# Patient Record
Sex: Male | Born: 1940 | Race: White | Hispanic: No
Health system: Southern US, Community
[De-identification: ages and names within clinical notes are randomized; demographics above are authoritative.]

---

## 2014-09-15 ENCOUNTER — Ambulatory Visit: Payer: Self-pay | Admitting: Family Medicine

## 2014-09-23 ENCOUNTER — Ambulatory Visit: Payer: Self-pay | Admitting: Family Medicine

## 2014-10-28 ENCOUNTER — Ambulatory Visit
Admit: 2014-10-28 | Disposition: A | Discharge status: Home / Self Care | Payer: Self-pay | Attending: Oncology | Admitting: Oncology

## 2014-10-29 ENCOUNTER — Ambulatory Visit
Admit: 2014-10-29 | Disposition: A | Discharge status: Home / Self Care | Payer: Self-pay | Attending: Oncology | Admitting: Oncology

## 2014-11-13 ENCOUNTER — Ambulatory Visit
Admit: 2014-11-13 | Disposition: A | Discharge status: Home / Self Care | Payer: Self-pay | Attending: Oncology | Admitting: Oncology

## 2014-11-30 ENCOUNTER — Ambulatory Visit
Admit: 2014-11-30 | Disposition: A | Discharge status: Home / Self Care | Payer: Self-pay | Attending: Oncology | Admitting: Oncology

## 2014-12-06 ENCOUNTER — Inpatient Hospital Stay
Admit: 2014-12-06 | Disposition: A | Discharge status: Hospice | Payer: Self-pay | Attending: Internal Medicine | Admitting: Internal Medicine

## 2014-12-06 LAB — URINALYSIS, COMPLETE
BILIRUBIN, UR: NEGATIVE
Bacteria: NONE SEEN
Glucose,UR: NEGATIVE mg/dL (ref 0–75)
KETONE: NEGATIVE
Leukocyte Esterase: NEGATIVE
Nitrite: NEGATIVE
PH: 6 (ref 4.5–8.0)
Specific Gravity: 1.023 (ref 1.003–1.030)

## 2014-12-06 LAB — CBC WITH DIFFERENTIAL/PLATELET
BASOS ABS: 0 10*3/uL (ref 0.0–0.1)
Basophil %: 0.2 %
Eosinophil #: 0.1 10*3/uL (ref 0.0–0.7)
Eosinophil %: 0.6 %
HCT: 39 % — ABNORMAL LOW (ref 40.0–52.0)
HGB: 13.1 g/dL (ref 13.0–18.0)
LYMPHS ABS: 1.4 10*3/uL (ref 1.0–3.6)
Lymphocyte %: 7 %
MCH: 31.5 pg (ref 26.0–34.0)
MCHC: 33.5 g/dL (ref 32.0–36.0)
MCV: 94 fL (ref 80–100)
Monocyte #: 1.3 x10 3/mm — ABNORMAL HIGH (ref 0.2–1.0)
Monocyte %: 6.8 %
Neutrophil #: 16.7 10*3/uL — ABNORMAL HIGH (ref 1.4–6.5)
Neutrophil %: 85.4 %
PLATELETS: 160 10*3/uL (ref 150–440)
RBC: 4.14 10*6/uL — ABNORMAL LOW (ref 4.40–5.90)
RDW: 13.8 % (ref 11.5–14.5)
WBC: 19.6 10*3/uL — ABNORMAL HIGH (ref 3.8–10.6)

## 2014-12-06 LAB — COMPREHENSIVE METABOLIC PANEL
ALBUMIN: 3.6 g/dL
ALK PHOS: 251 U/L — AB
ANION GAP: 10 (ref 7–16)
BILIRUBIN TOTAL: 1.1 mg/dL
BUN: 47 mg/dL — ABNORMAL HIGH
CREATININE: 0.92 mg/dL
Calcium, Total: 9.6 mg/dL
Chloride: 98 mmol/L — ABNORMAL LOW
Co2: 30 mmol/L
EGFR (Non-African Amer.): >60
GLUCOSE: 164 mg/dL — AB
Potassium: 3.5 mmol/L
SGOT(AST): 122 U/L — ABNORMAL HIGH
SGPT (ALT): 70 U/L — ABNORMAL HIGH
Sodium: 138 mmol/L
TOTAL PROTEIN: 7.6 g/dL

## 2014-12-06 LAB — APTT: Activated PTT: 34.4 secs (ref 23.6–35.9)

## 2014-12-06 LAB — PROTIME-INR
INR: 1.1
PROTHROMBIN TIME: 14.3 s

## 2014-12-06 LAB — LACTIC ACID, PLASMA: Lactic Acid, Venous: 1 mmol/L

## 2014-12-06 LAB — TROPONIN I: Troponin-I: <0.03 ng/mL

## 2014-12-07 ENCOUNTER — Ambulatory Visit: Admit: 2014-12-07 | Disposition: A | Discharge status: Home / Self Care | Payer: Self-pay | Admitting: Vascular Surgery

## 2014-12-07 LAB — COMPREHENSIVE METABOLIC PANEL
ALT: 63 U/L
ANION GAP: 7 (ref 7–16)
Albumin: 3 g/dL — ABNORMAL LOW
Alkaline Phosphatase: 192 U/L — ABNORMAL HIGH
BUN: 38 mg/dL — AB
Bilirubin,Total: 1.5 mg/dL — ABNORMAL HIGH
CHLORIDE: 104 mmol/L
CREATININE: 0.73 mg/dL
Calcium, Total: 9 mg/dL
Co2: 28 mmol/L
EGFR (African American): >60
EGFR (Non-African Amer.): >60
Glucose: 124 mg/dL — ABNORMAL HIGH
POTASSIUM: 3.8 mmol/L
SGOT(AST): 101 U/L — ABNORMAL HIGH
SODIUM: 139 mmol/L
TOTAL PROTEIN: 6.3 g/dL — AB

## 2014-12-07 LAB — CBC WITH DIFFERENTIAL/PLATELET
BASOS PCT: 0.2 %
Basophil #: 0 10*3/uL (ref 0.0–0.1)
Eosinophil #: 0.1 10*3/uL (ref 0.0–0.7)
Eosinophil %: 0.6 %
HCT: 35.2 % — ABNORMAL LOW (ref 40.0–52.0)
HGB: 11.7 g/dL — ABNORMAL LOW (ref 13.0–18.0)
Lymphocyte #: 1.2 10*3/uL (ref 1.0–3.6)
Lymphocyte %: 6.8 %
MCH: 31.2 pg (ref 26.0–34.0)
MCHC: 33.3 g/dL (ref 32.0–36.0)
MCV: 94 fL (ref 80–100)
MONO ABS: 1.5 x10 3/mm — AB (ref 0.2–1.0)
Monocyte %: 8 %
NEUTROS ABS: 15.6 10*3/uL — AB (ref 1.4–6.5)
NEUTROS PCT: 84.4 %
Platelet: 142 10*3/uL — ABNORMAL LOW (ref 150–440)
RBC: 3.77 10*6/uL — ABNORMAL LOW (ref 4.40–5.90)
RDW: 13.4 % (ref 11.5–14.5)
WBC: 18.5 10*3/uL — ABNORMAL HIGH (ref 3.8–10.6)

## 2014-12-11 LAB — CULTURE, BLOOD (SINGLE)

## 2014-12-13 NOTE — Op Note (Signed)
PATIENT NAME:  [name], [name] MR#:  [id number] DATE OF BIRTH:  08/21/1940  DATE OF PROCEDURE:  12/07/2014  PREOPERATIVE DIAGNOSES:  1. Peripheral arterial disease with gangrene, left lower extremity.  2. Stage IV liver cancer.  3. Ulceration, left foot and toes.   POSTOPERATIVE DIAGNOSES: 1. Peripheral arterial disease with gangrene, left lower extremity.  2. Stage IV liver cancer.  3. Ulceration, left foot and toes.  4. Vein thrombosis embolus to left lower extremity tibial arteries.   PROCEDURES PERFORMED:  Include: 1. Ultrasound-guidance vascular access to right femoral artery.  2. Catheter placement to left anterior tibial and left posterior tibial arteries from right femoral approach.  3. Aortogram and selective left lower extremity angiogram.  4. Percutaneous transluminal angioplasty of right common iliac artery with 5 mm diameter Lutonix drug-coated angioplasty balloon.  5. Percutaneous transluminal angioplasty of left common and external iliac artery with 6 mm diameter Lutonix drug-coated angioplasty balloon.  6. Catheter-directed thrombolysis with 4 mg of tPA delivered into the left anterior tibial and left posterior tibial arteries.  7. Mechanical rheolytic thrombectomy of left anterior tibial and posterior tibial arteries, tibioperoneal trunk, and distal popliteal artery.  8. Percutaneous transluminal angioplasty of left anterior tibial artery with 3 mm diameter angioplasty balloon.  9. Percutaneous transluminal angioplasty of left posterior tibial artery with 3 mm diameter angioplasty balloon.  10.  Percutaneous transluminal angioplasty of left superficial femoral artery with 5 mm diameter Lutonix drug-coated angioplasty balloon.  11.  Self-expanding stent placement to the left superficial femoral artery for greater than 50% residual stenosis and dissection after angioplasty.  12.  StarClose closure device, right femoral artery.   SURGEON:  Joshua S. Dew, MD.   ANESTHESIA:   Local with moderate conscious sedation.   BLOOD LOSS:  Minimal.   CONTRAST USED:  105 mL of Visipaque.   FLUOROSCOPY TIME:  12 minutes.   INDICATION FOR PROCEDURE:  This is a gentleman with advanced liver cancer who I saw in the office with ischemic rest pain last week.  Over the weekend, he began having skin changes to the foot consistent with gangrene.  He was admitted to the hospital last night.  He was brought in this morning for angiography for further evaluation and potential treatment.  Risks and benefits were discussed.  Informed consent was obtained.   DESCRIPTION OF PROCEDURE:  The patient was brought to the vascular suite. Groins were shaved and prepped, and a sterile surgical field was created.  Due to poor a palpable right femoral pulse, ultrasound was used to access the right femoral artery.  This was done under direct ultrasound guidance without difficulty with a Seldinger needle.  The right femoral artery was calcified but not particularly stenotic.  The artery was accessed under direct ultrasound guidance without difficulty and a permanent image was recorded.   A J-wire was placed.  It did not easily traverse through the right iliac system.  Imaging through the right femoral sheath showed diffuse disease throughout the right common and proximal external iliac artery with severe stenosis of greater than 75%.  I was able to navigate a Kumpe catheter and a Terumo Advantage wire through this.  A pigtail catheter was placed into the aorta at  the L1-L2 level and AP aortogram was performed.  I then pulled down and performed the pelvic obliques.  I went ahead and heparinized the patient with 5000 units of intravenous heparin.  The imaging demonstrated the aorta to be patent although heavily calcified.  The   right iliac system had high-grade stenosis, as described above.  The left renal artery had moderate to high-grade stenosis.  The right renal artery appeared patent without significant stenosis.   The left common and external iliac artery had at least moderate-appearing stenosis in the 60% to 70% range over a several centimeter segment.  I then crossed the aortic bifurcation with a RIM catheter and Advantage wire and advanced down into the left common femoral artery.  This demonstrated the SFA to be somewhat diffusely diseased in the proximal and mid segment.  In the mid-to-distal SFA, there was short-segment occlusion of the SFA with reconstitution in the distal SFA.  The popliteal artery appeared reasonably normal; however, there was very minimal flow in the tibial arteries.  I advanced a 100 cm Kumpe catheter with the Advantage wire into the popliteal artery.  Essentially there was a cutoff of flow in the anterior tibial artery and then in the posterior tibial arteries in their mid and then proximal segments, respectively.  I initially advanced the Kumpe catheter into the anterior tibial artery and performed imaging.  Again this appeared to be thrombosed from the mid anterior tibial artery on.  I then removed this catheter.  I actually exchanged for a 0.0120 straight glide cath and cannulated the posterior tibial artery with the Advantage wire and again this was occluded from the proximal segment on.  This appeared to be thrombosis.  I advanced a catheter down into the distal posterior tibial artery without difficulty and advanced the catheter and confirmed intraluminal flow in the foot.  I then removed this catheter and did similarly on the anterior tibial artery.  Prior to placing the large sheath over, a 5 mm diameter x 8 cm Lutonix drug-coated angioplasty balloon was used to treat the right common and external iliac artery with good angiographic result and about 30% residual stenosis. I then took a 6-French Ansel sheath down into the SFA.  I had cannulated both anterior and posterior tibial arteries and planned our treatment of the left foot at this point after catheter placements.  I then used the  AngioJet Omni catheter.  I instilled 4 mg of tPA with about 2 mg in the anterior tibial artery.  I then allowed this to dwell and cannulated the posterior tibial artery.  I advanced the wire down to the foot and put 2 more milligrams of tPA into the posterior tibial artery.  While this was dwelling, the superficial femoral artery was treated.  I used a 5 mm diameter x 15 cm Lutonix drug-coated angioplasty balloon, but there was a suboptimal result after angioplasty in the mid-to-distal left SFA, and I had to cover this area with a 6 mm diameter x 15 cm length LifeStent.  This was post dilated with a 5 mm balloon.  The proximal SFA was treated with a 5 mm balloon with good angiographic completion result.  We then improved his SFA but there was still very poor flow distally.  Mechanical rheolytic thrombectomy was performed first in the anterior tibial artery for about 100 mL of effluent returned.  I then went down the posterior tibial artery for another 65-70 mL of effluent returned.  Nonetheless, there was still very poor flow distally.  I then brought a 3 mm diameter angioplasty balloon, started in the posterior tibial artery from the ankle up through the origin and into the tibioperoneal trunk. Again multiple areas of waste were seen with inflation of the balloon and the balloon was deflated, and   even with a catheter placed selectively into the posterior tibial artery, there was very sluggish flow that terminated before getting to the foot.  This did appear to have the appearance of thrombus on imaging.  At this point, I turned my attention back to the anterior tibial artery.  The Advantage wire was placed into the foot there.  I inflated from the ankle back up through the origin of the anterior tibial artery with a 3 mm diameter angioplasty balloon.  This did result in a little bit of flow going through the anterior tibial artery but there was still thrombus distally.  I felt at this point I had done all I could  do from a revascularization standpoint and we would have to try to anticoagulate him with the exception of the iliac lesion on the left.  I pulled the sheath back and then treated this with a 6 mm diameter Lutonix drug-coated angioplasty balloon in the common and proximal external iliac artery.  The balloon inflation was held for 1  minute.  Completion angiogram following this showed significantly improved flow with only about 20% or 25% residual stenosis.  At this point, I elected to terminate the procedure.  The sheath was removed.  StarClose closure device was deployed in the usual fashion with excellent hemostatic result.  The patient tolerated the procedure well and was taken to the recovery room in stable condition.    ____________________________ Joshua S. Dew, MD jsd:kc D: 12/07/2014 12:39:47 ET T: 12/07/2014 16:23:24 ET JOB#: [id number]  cc: Joshua S. Dew, MD, <Dictator> Joshua S DEW MD ELECTRONICALLY SIGNED 12/11/2014 17:27 

## 2014-12-13 NOTE — H&P (Signed)
PATIENT NAME:  Joshua [name], Joshua [name] MR#:  [id number] DATE OF BIRTH:  10-16-1940  DATE OF ADMISSION:  12/07/2014  REFERRING PHYSICIAN: Bobetta Lime A. Inocencio Homes, MD  PRIMARY CARE PHYSICIAN: Steele Sizer, MD  ONCOLOGY: Gerome Sam. Choksi, MD  CHIEF COMPLAINT: Left foot pain.   HISTORY OF PRESENT ILLNESS: A 74 year old Caucasian male with a history of hypertension, essential; gastroesophageal reflux disease without esophagitis; unspecified cancer, this is either extensive hepatocellular carcinoma versus liver metastases with unknown primary. He actually refused any further workup. He is presenting with left foot pain. The patient is a poor historian. History obtained from him as well as daughter who is present at  bedside. Describes a 2-week-or-so duration of progressive left foot pain described only as "pain." Intensity is 7/10, worse with movements, no relieving factors. Noticed associated color change of the foot; progressive bluish to black changes, distal moving proximally; and noticed that it was cold to touch. Given symptom duration and worsening in intensity, decided to present to the hospital for further workup and evaluation after speaking to his vascular surgeon, Dr. Wyn Quaker.   REVIEW OF SYSTEMS: CONSTITUTIONAL: Denies fevers, chills, fatigue, weakness.  EYES: Denies blurred vision, double vision, eye pain.  EARS, NOSE, THROAT: Denies tinnitus, ear pain, hearing loss.  RESPIRATORY: Denies cough, wheeze, shortness of breath.  CARDIOVASCULAR: Denies chest pain, palpitations, edema. GASTROINTESTINAL: Denies nausea, vomiting, diarrhea, abdominal pain. GENITOURINARY: Denies dysuria, hematuria. ENDOCRINE: Denies nocturia or thyroid problems.  HEMATOLOGIC AND LYMPHATIC: Denies easy bruising and bleeding.  SKIN: Positive for bluish black discoloration of the left foot as described above.  MUSCULOSKELETAL: Positive for pain in the left foot as described above. Denies any neck, back, shoulder, knees, hip pain, or  further arthritic symptoms. NEUROLOGIC: Denies paralysis, paresthesias.  PSYCHIATRIC: Denies anxiety or depressive symptoms.  Otherwise, full review of systems performed by me is negative.   PAST MEDICAL HISTORY: Includes unspecified cancer, either extensive hepatocellular carcinoma versus liver metastasis of unknown primary; gastroesophageal reflux disease without esophagitis; hypertension, essential; peripheral vascular disease.   SOCIAL HISTORY: Positive for everyday tobacco use. Denies any alcohol or drug use.   FAMILY HISTORY: Positive for coronary artery disease.   ALLERGIES: No known drug allergies.   HOME MEDICATIONS: Include fentanyl patch 75 mcg every 72 hours, oxycodone 5 mg p.o. q.4-6 hours as needed for pain, lorazepam 0.5 mg p.o. daily, fluoxetine 10 mg p.o. daily, Ambien 5 mg p.o. at nighttime as needed for sleep, metoprolol 100 mg p.o. b.i.d., Norvasc 5 mg p.o. daily, melatonin 5 mg p.o. at bedtime, Nexium 20 mg p.o. daily.   PHYSICAL EXAMINATION:  VITAL SIGNS: Temperature 97.3, heart rate 107, respiratory rate is 18, blood pressure 143/83, saturating 92% on room air. Weight 53.1 kg, BMI 18.3.  GENERAL: Disheveled, weak, frail, chronically ill-appearing Caucasian gentleman, currently in no acute distress.  HEAD: Normocephalic, atraumatic.  EYES: Pupils equal, round, reactive to light. Extraocular muscles intact. No scleral icterus.  MOUTH: Moist mucosal membranes. Dentition intact. No abscess noted. EARS, NOSE, THROAT: Clear without exudates. No external lesions.  NECK: Supple. No thyromegaly. No nodules. No JVD.  PULMONARY: Clear to auscultation bilaterally without wheezes, rales, rhonchi. No use of accessory muscles. Good respiratory effort. CHEST: Nontender to palpation.  CARDIOVASCULAR: S1, S2, tachycardic. No murmurs, rubs, or gallops. No edema. Pedal pulses diminished, left pedal pulse absent on Doppler.  GASTROINTESTINAL: Soft, nontender, nondistended. No masses.  Positive bowel sounds. No hepatosplenomegaly. MUSCULOSKELETAL: No swelling, clubbing. Positive for edema in the left foot to the mid shin.  Range of motion full in all extremities.  NEUROLOGIC: Cranial nerves II through XII intact. No gross focal neurological deficits. Sensation intact. Reflexes intact.  SKIN: Left distal foot with bluish blackish discoloration covering the entire plantar surface of the foot and extending to about halfway up the foot as well. No further lesions, rashes, ulcerations, or further areas of cyanosis. Skin warm, dry; however, left lower extremity about shin down cold to touch.  PSYCHIATRIC: Mood, affect blunted. He is awake, alert, oriented x3. Insight and judgment intact.   LABORATORY DATA: Sodium of 138, potassium 3.5, chloride 98, bicarbonate of 30, BUN 47, creatinine 0.92, glucose 164. LFTs: Alkaline phosphatase 251, AST 122, ALT 70, otherwise within normal limits. WBC of 19.6, hemoglobin 13.1, platelets of 160,000.    ASSESSMENT AND PLAN: A 74 year old Caucasian gentleman with a history of  hypertension, essential; gastroesophageal reflux disease without esophagitis; as well as unspecified cancer, either extensive hepatocellular carcinoma versus liver metastasis with unknown primary, presenting with left foot pain. 1.  Ischemic left foot. The case was discussed with Dr. Wyn Quakerew of vascular surgery. He is to perform an angiogram in the morning. Continue with pain medications as required. Antibiotic coverage with vancomycin as well as Zosyn. 2.  Gastroesophageal reflux disease. Continue proton pump inhibitor therapy. 3.  Hypertension, essential. Continue with his dosages of Norvasc as well as metoprolol.  CODE STATUS: The patient is full code.   TIME SPENT: 35 minutes.   ____________________________ Cletis Athensavid K. Kerim Statzer, MD dkh:ST D: 12/06/2014 22:18:38 ET T: 12/07/2014 00:38:47 ET JOB#: [id number]  cc: Cletis Athensavid K. Gayathri Futrell, MD, <Dictator> Khadejah Son Synetta ShadowK Shley Dolby MD ELECTRONICALLY SIGNED  12/09/2014 1:42

## 2014-12-13 NOTE — Discharge Summary (Signed)
PATIENT NAME:  Joshua [name], Enos E MR#:  [id number] DATE OF BIRTH:  June 11, 1941  DATE OF ADMISSION:  12/06/2014 DATE OF DISCHARGE:  12/08/2014  For a detailed note, please take a look at the history and physical done on admission by Dr. Angelica Ranavid Hower.   DIAGNOSES AT DISCHARGE: As follows: Acute left lower extremity ischemia, status post angioplasty and stent placement, advanced hepatocellular carcinoma, depression, chronic pain, urinary retention.   DIET: The patient is being discharged on a regular diet.   ACTIVITY: As tolerated. The patient is being discharged home with hospice services.   FOLLOWUP: With Dr. Vonita MossMark Crissman, and also with Dr. Doylene Canninghoksi in the next 1 to 2 weeks.   DISCHARGE MEDICATIONS: Metoprolol tartrate 100 mg b.i.d., lorazepam 0.5 mg daily, omeprazole 20 mg daily, fluoxetine 10 mg daily, Ambien 5 mg at bedtime, oxycodone 5 mg every 4 to 6 hours as needed, amlodipine 5 mg daily, Duragesic patch 75 mcg q. 72 hours, melatonin 3 mg extended release at bedtime, Nexium 20 mg daily, Flomax 0.4 mg daily, apixaban 2.5 mg b.i.d., and Seroquel 25 mg at bedtime.   CONSULTANTS DURING THE HOSPITAL COURSE: Dr. Festus BarrenJason Dew from vascular surgery.   BRIEF HOSPITAL COURSE: This is a 74 year old male with medical problems as mentioned above, presented to the hospital with left lower extremity redness, cyanosis, pain, and blistering.   PROBLEM LIST: 1.  Acute left lower extremity ischemia. This was likely the cause of the patient's left lower extremity pain, swelling, and blistering. The patient underwent angiogram by Dr. Wyn Quakerew, was noted to have multivessel disease; therefore, underwent angioplasty and stent to multiple vessels of his left lower extremity. Despite getting the intervention, his clinical symptoms have mildly improved. Given his history of advanced hepatocellular carcinoma, h e is high risk for having worsening vascular disease. He is therefore being discharged on oral Eliquis. As stated, he will  continue fentanyl patch and oral oxycodone for pain. He is being discharged home with hospice services for further pain control. He was on broad-spectrum IV antibiotics with IV vancomycin and Zosyn, which has now been discontinued.  2.  History of advanced liver cancer. The patient's prognosis is very poor. He follows with Dr. Doylene Canninghoksi. The family has opted for hospice services, which he is being discharged home with hospice. 3.  Depression. The patient was maintained on his Prozac, he will resume that.  4.  Urinary retention. The patient was started on Flomax. He received in and out catheters while in the hospital, although he is voiding well now.  5.  Hypertension. The patient remained hemodynamically stable. He will continue metoprolol and Norvasc. 6.  Gastroesophageal reflux disease. The patient will continue his Protonix.  7.  Insomnia. The patient will continue his Ambien and he was started on a small dose of Seroquel.   CODE STATUS:  The patient is a full code.   TIME SPENT ON DISCHARGE: Forty minutes.    ____________________________ Rolly PancakeVivek J. Cherlynn KaiserSainani, MD vjs:TM D: 12/08/2014 16:01:36 ET T: 12/09/2014 00:11:58 ET JOB#: [id number]  cc: Rolly PancakeVivek J. Cherlynn KaiserSainani, MD, <Dictator> Steele SizerMark A. Crissman, MD Gerome SamJanak K. Doylene Canninghoksi, MD Houston SirenVIVEK J Geneviene Tesch MD ELECTRONICALLY SIGNED 12/11/2014 14:41

## 2014-12-18 NOTE — Op Note (Signed)
PATIENT NAME:  Joshua [name], Joshua [name] MR#:  [id number] DATE OF BIRTH:  09/28/1940  DATE OF PROCEDURE:  12/07/2014  PREOPERATIVE DIAGNOSES:  1. Peripheral arterial disease with gangrene, left lower extremity.  2. Stage IV liver cancer.  3. Ulceration, left foot and toes.   POSTOPERATIVE DIAGNOSES: 1. Peripheral arterial disease with gangrene, left lower extremity.  2. Stage IV liver cancer.  3. Ulceration, left foot and toes.  4. Vein thrombosis embolus to left lower extremity tibial arteries.   PROCEDURES PERFORMED:  Include: 1. Ultrasound-guidance vascular access to right femoral artery.  2. Catheter placement to left anterior tibial and left posterior tibial arteries from right femoral approach.  3. Aortogram and selective left lower extremity angiogram.  4. Percutaneous transluminal angioplasty of right common iliac artery with 5 mm diameter Lutonix drug-coated angioplasty balloon.  5. Percutaneous transluminal angioplasty of left common and external iliac artery with 6 mm diameter Lutonix drug-coated angioplasty balloon.  6. Catheter-directed thrombolysis with 4 mg of tPA delivered into the left anterior tibial and left posterior tibial arteries.  7. Mechanical rheolytic thrombectomy of left anterior tibial and posterior tibial arteries, tibioperoneal trunk, and distal popliteal artery.  8. Percutaneous transluminal angioplasty of left anterior tibial artery with 3 mm diameter angioplasty balloon.  9. Percutaneous transluminal angioplasty of left posterior tibial artery with 3 mm diameter angioplasty balloon.  10.  Percutaneous transluminal angioplasty of left superficial femoral artery with 5 mm diameter Lutonix drug-coated angioplasty balloon.  11.  Self-expanding stent placement to the left superficial femoral artery for greater than 50% residual stenosis and dissection after angioplasty.  12.  StarClose closure device, right femoral artery.   SURGEON:  Annice Needy, MD.   ANESTHESIA:   Local with moderate conscious sedation.   BLOOD LOSS:  Minimal.   CONTRAST USED:  105 mL of Visipaque.   FLUOROSCOPY TIME:  12 minutes.   INDICATION FOR PROCEDURE:  This is a gentleman with advanced liver cancer who I saw in the office with ischemic rest pain last week.  Over the weekend, he began having skin changes to the foot consistent with gangrene.  He was admitted to the hospital last night.  He was brought in this morning for angiography for further evaluation and potential treatment.  Risks and benefits were discussed.  Informed consent was obtained.   DESCRIPTION OF PROCEDURE:  The patient was brought to the vascular suite. Groins were shaved and prepped, and a sterile surgical field was created.  Due to poor a palpable right femoral pulse, ultrasound was used to access the right femoral artery.  This was done under direct ultrasound guidance without difficulty with a Seldinger needle.  The right femoral artery was calcified but not particularly stenotic.  The artery was accessed under direct ultrasound guidance without difficulty and a permanent image was recorded.   A J-wire was placed.  It did not easily traverse through the right iliac system.  Imaging through the right femoral sheath showed diffuse disease throughout the right common and proximal external iliac artery with severe stenosis of greater than 75%.  I was able to navigate a Kumpe catheter and a Terumo Advantage wire through this.  A pigtail catheter was placed into the aorta at  the L1-L2 level and AP aortogram was performed.  I then pulled down and performed the pelvic obliques.  I went ahead and heparinized the patient with 5000 units of intravenous heparin.  The imaging demonstrated the aorta to be patent although heavily calcified.  The  right iliac system had high-grade stenosis, as described above.  The left renal artery had moderate to high-grade stenosis.  The right renal artery appeared patent without significant stenosis.   The left common and external iliac artery had at least moderate-appearing stenosis in the 60% to 70% range over a several centimeter segment.  I then crossed the aortic bifurcation with a RIM catheter and Advantage wire and advanced down into the left common femoral artery.  This demonstrated the SFA to be somewhat diffusely diseased in the proximal and mid segment.  In the mid-to-distal SFA, there was short-segment occlusion of the SFA with reconstitution in the distal SFA.  The popliteal artery appeared reasonably normal; however, there was very minimal flow in the tibial arteries.  I advanced a 100 cm Kumpe catheter with the Advantage wire into the popliteal artery.  Essentially there was a cutoff of flow in the anterior tibial artery and then in the posterior tibial arteries in their mid and then proximal segments, respectively.  I initially advanced the Kumpe catheter into the anterior tibial artery and performed imaging.  Again this appeared to be thrombosed from the mid anterior tibial artery on.  I then removed this catheter.  I actually exchanged for a 0.0120 straight glide cath and cannulated the posterior tibial artery with the Advantage wire and again this was occluded from the proximal segment on.  This appeared to be thrombosis.  I advanced a catheter down into the distal posterior tibial artery without difficulty and advanced the catheter and confirmed intraluminal flow in the foot.  I then removed this catheter and did similarly on the anterior tibial artery.  Prior to placing the large sheath over, a 5 mm diameter x 8 cm Lutonix drug-coated angioplasty balloon was used to treat the right common and external iliac artery with good angiographic result and about 30% residual stenosis. I then took a 6-French Ansel sheath down into the SFA.  I had cannulated both anterior and posterior tibial arteries and planned our treatment of the left foot at this point after catheter placements.  I then used the  AngioJet Omni catheter.  I instilled 4 mg of tPA with about 2 mg in the anterior tibial artery.  I then allowed this to dwell and cannulated the posterior tibial artery.  I advanced the wire down to the foot and put 2 more milligrams of tPA into the posterior tibial artery.  While this was dwelling, the superficial femoral artery was treated.  I used a 5 mm diameter x 15 cm Lutonix drug-coated angioplasty balloon, but there was a suboptimal result after angioplasty in the mid-to-distal left SFA, and I had to cover this area with a 6 mm diameter x 15 cm length LifeStent.  This was post dilated with a 5 mm balloon.  The proximal SFA was treated with a 5 mm balloon with good angiographic completion result.  We then improved his SFA but there was still very poor flow distally.  Mechanical rheolytic thrombectomy was performed first in the anterior tibial artery for about 100 mL of effluent returned.  I then went down the posterior tibial artery for another 65-70 mL of effluent returned.  Nonetheless, there was still very poor flow distally.  I then brought a 3 mm diameter angioplasty balloon, started in the posterior tibial artery from the ankle up through the origin and into the tibioperoneal trunk. Again multiple areas of waste were seen with inflation of the balloon and the balloon was deflated, and  even with a catheter placed selectively into the posterior tibial artery, there was very sluggish flow that terminated before getting to the foot.  This did appear to have the appearance of thrombus on imaging.  At this point, I turned my attention back to the anterior tibial artery.  The Advantage wire was placed into the foot there.  I inflated from the ankle back up through the origin of the anterior tibial artery with a 3 mm diameter angioplasty balloon.  This did result in a little bit of flow going through the anterior tibial artery but there was still thrombus distally.  I felt at this point I had done all I could  do from a revascularization standpoint and we would have to try to anticoagulate him with the exception of the iliac lesion on the left.  I pulled the sheath back and then treated this with a 6 mm diameter Lutonix drug-coated angioplasty balloon in the common and proximal external iliac artery.  The balloon inflation was held for 1  minute.  Completion angiogram following this showed significantly improved flow with only about 20% or 25% residual stenosis.  At this point, I elected to terminate the procedure.  The sheath was removed.  StarClose closure device was deployed in the usual fashion with excellent hemostatic result.  The patient tolerated the procedure well and was taken to the recovery room in stable condition.    ____________________________ Annice NeedyJason S. Sallee Hogrefe, MD [id number] D: 12/07/2014 12:39:47 ET T: 12/07/2014 16:23:24 ET JOB#: [id number]  cc: Annice NeedyJason S. Del Wiseman, MD, <Dictator> Annice NeedyJASON S Welby Montminy MD ELECTRONICALLY SIGNED 12/11/2014 17:27

## 2015-11-12 IMAGING — US ABDOMEN ULTRASOUND
1 series · 13 of 25 positions shown · non-contrast
Comparison: None.

CLINICAL DATA: Chronic abdominal pain and distention

EXAM:
ULTRASOUND ABDOMEN COMPLETE

[Series 1: abdomen ultrasound · 0.38mm/px · 13 of 117 slices shown]
[im 1/117]
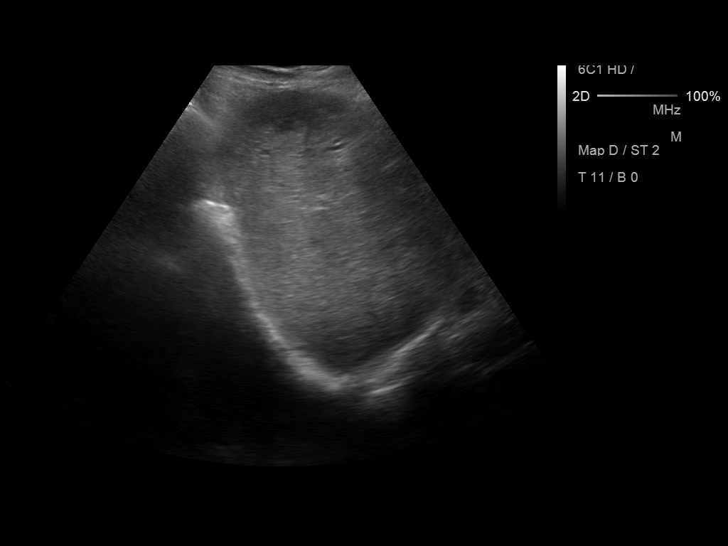
[im 10/117]
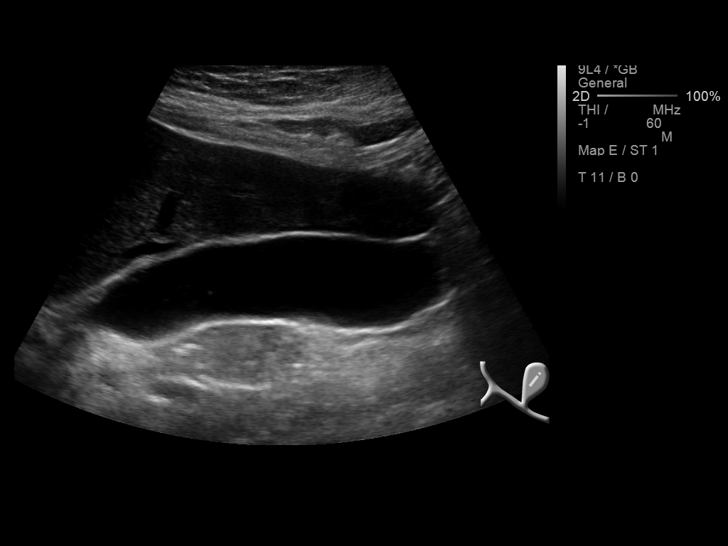
[im 20/117]
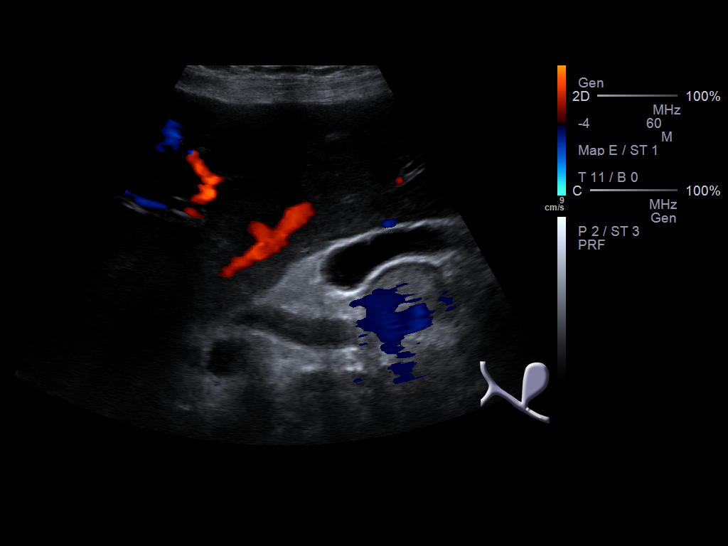
[im 30/117]
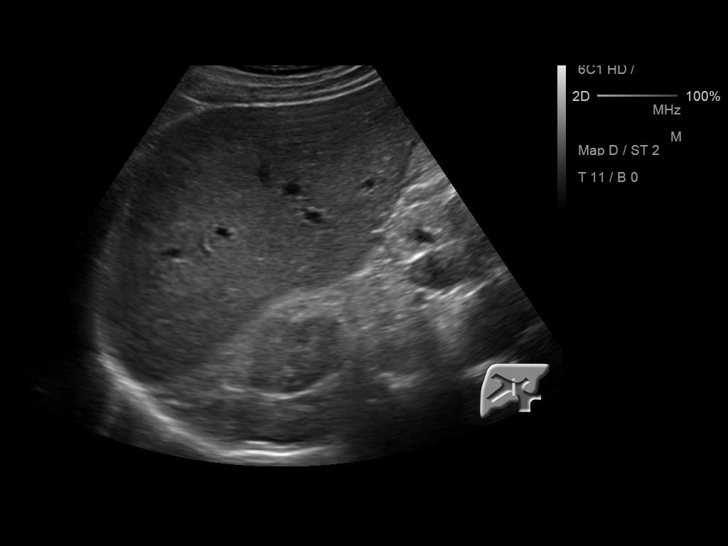
[im 39/117]
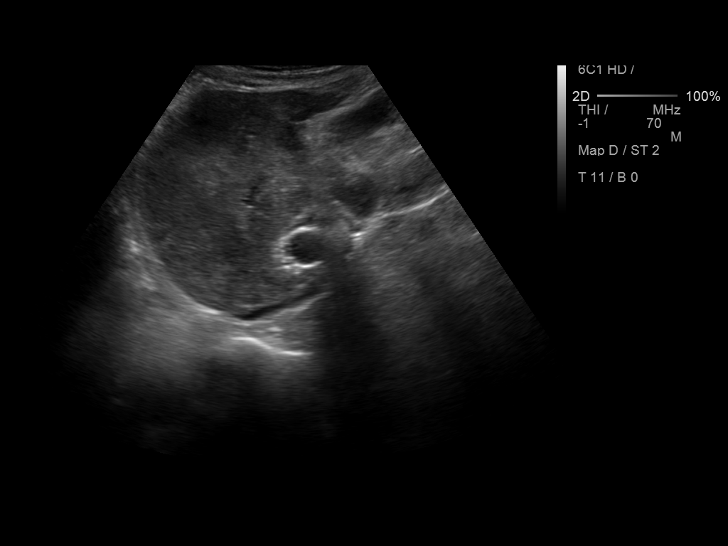
[im 49/117]
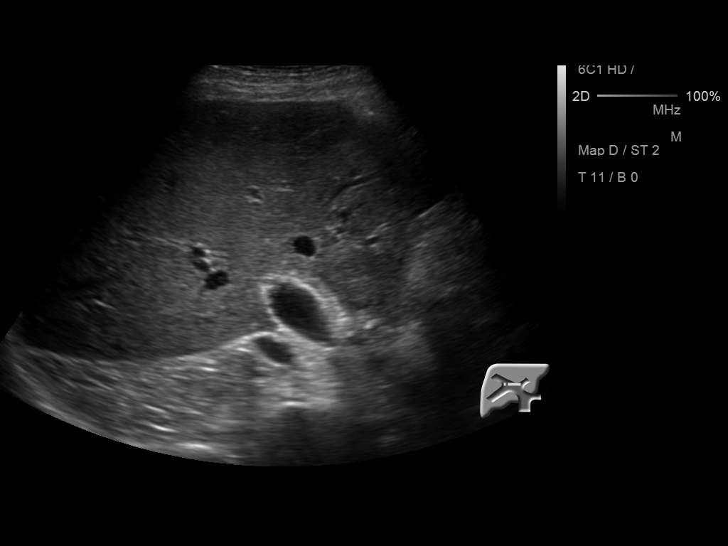
[im 59/117]
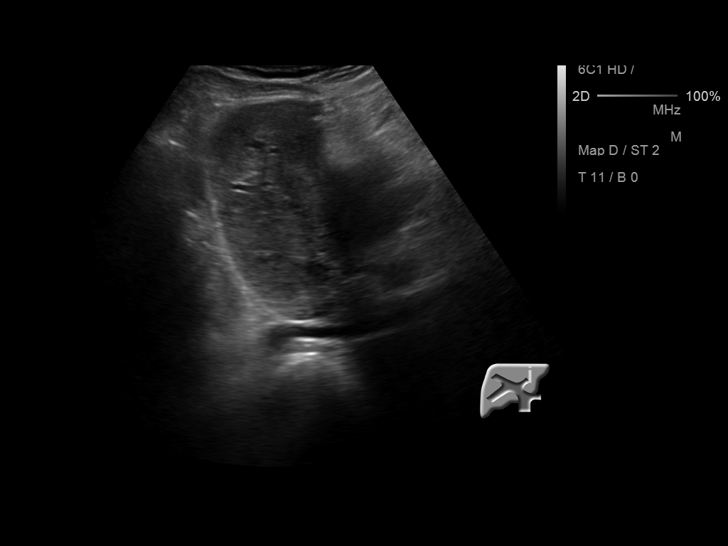
[im 68/117]
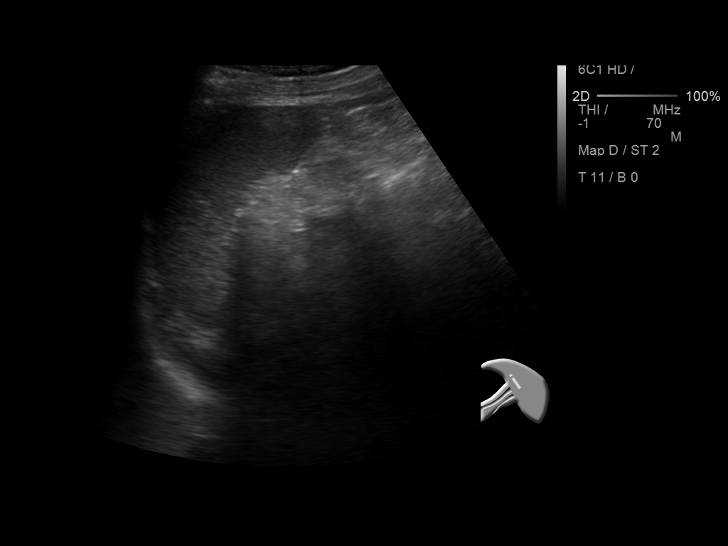
[im 78/117]
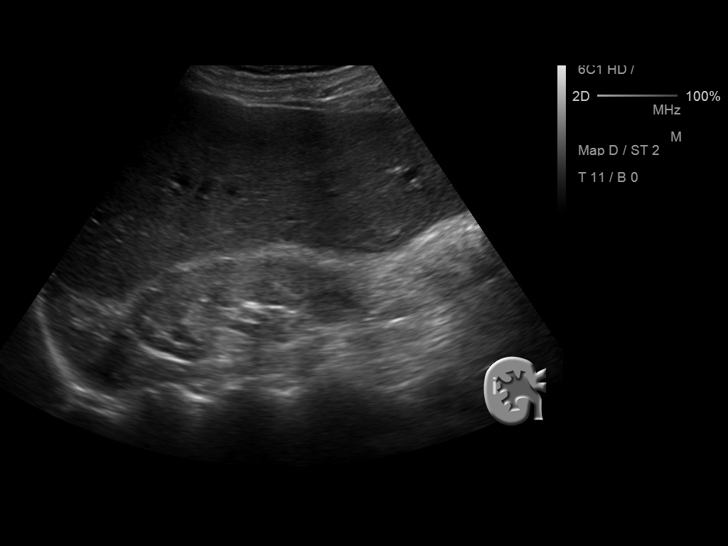
[im 88/117]
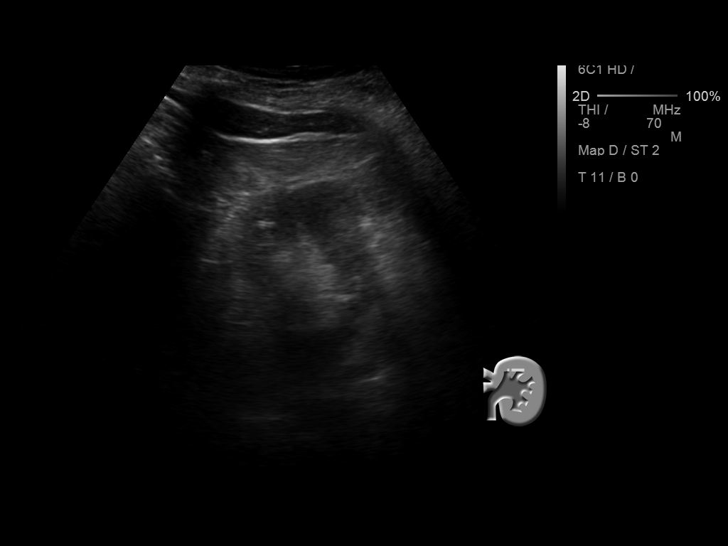
[im 97/117]
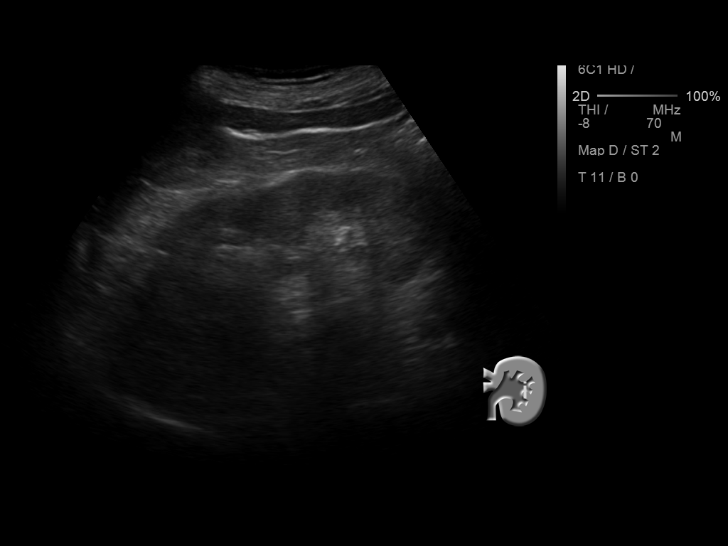
[im 107/117]
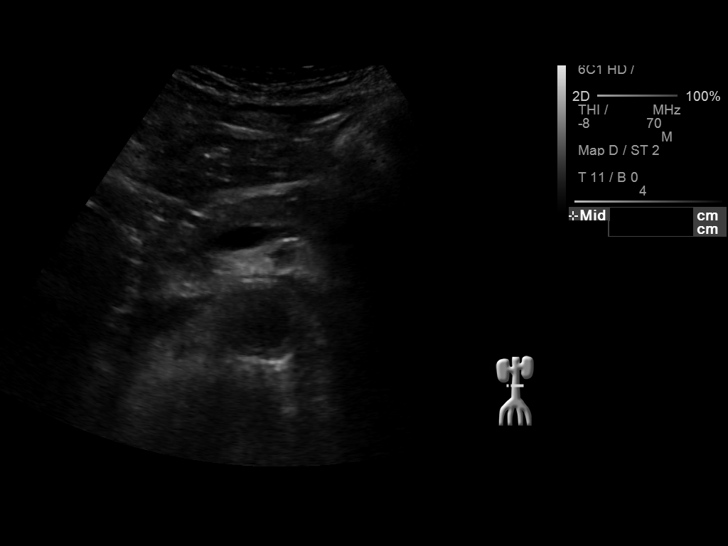
[im 117/117]
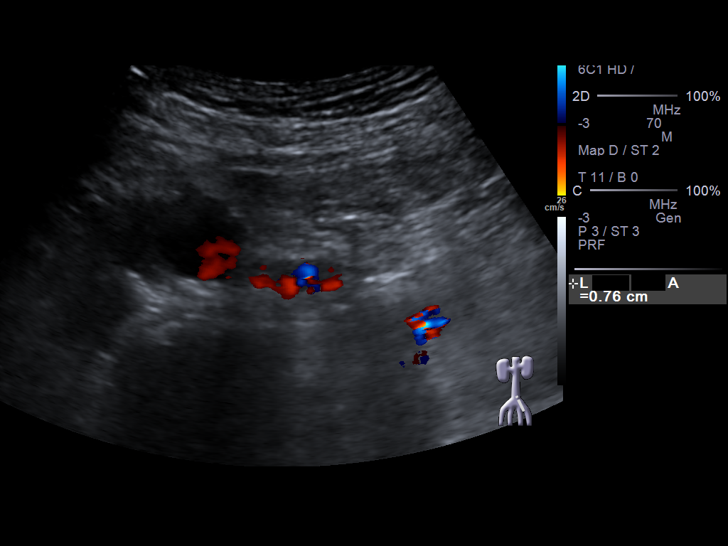

[13 of 25 positions shown; findings below may reference images not displayed]

FINDINGS: Gallbladder: The gallbladder is visualized and small gallstones
layer within the gallbladder, the largest of 4 mm in diameter. No
pain is present over the gallbladder and the gallbladder wall is not
thickened.

Common bile duct: Diameter: The common bile duct is within upper
limits of normal for age measuring 7.3 mm.

Liver: The liver is somewhat inhomogeneous but no focal hepatic
abnormality is noted. There is some dilatation of the left
intrahepatic ducts. A subtle lesion such ia cholangiocarcinoma can
create this appearance and further assessment by CT or MRI is
recommended.

IVC: No abnormality visualized.

Pancreas: The tail of the pancreas is obscured by bowel gas

Spleen: The spleen is normal measuring 4.7 cm.

Right Kidney: Length: 10.0 cm.. No hydronephrosis is seen. Tiny
right renal cysts are noted.

Left Kidney: Length: 11.1 cm.. No hydronephrosis is seen. and
exophytic cyst is noted laterally of 3.6 cm.

Abdominal aorta: There is irregularity of the abdominal aorta
consistent with atheromatous change. There is dilatation of the
abdominal aorta distally consistent with aneurysm measuring 3.3 x
2.9 x 3.3 cm. Recommend followup by ultrasound in 3 years. This
recommendation follows ACR consensus guidelines: White Paper of the
ACR Incidental Findings Committee II on Vascular Findings. [HOSPITAL] 9951; [DATE].

Other findings: None.
IMPRESSION: 1. Small distal abdominal aortic aneurysm of 3.3 cm in maximum
diameter. Recommend followup by ultrasound in 3 years. This
recommendation follows ACR consensus guidelines: White Paper of the
ACR Incidental Findings Committee II on Vascular Findings. [HOSPITAL] 9951; [DATE]
2. Somewhat prominent intrahepatic ducts in the left lobe of liver
could indicate a subtle hepatic lesion. Recommend CT or MRI to
assess further.
3. Small gallstones.
4. The tail of the pancreas is obscured by bowel gas.
